# Patient Record
Sex: Female | Born: 1980 | Race: White | Hispanic: No | Marital: Married | Smoking: Current every day smoker
Health system: Southern US, Community
[De-identification: ages and names within clinical notes are randomized; demographics above are authoritative.]

## PROBLEM LIST (undated history)

## (undated) DIAGNOSIS — C801 Malignant (primary) neoplasm, unspecified: Secondary | ICD-10-CM

## (undated) HISTORY — PX: BACK SURGERY: SHX140

## (undated) HISTORY — PX: COLON SURGERY: SHX602

---

## 2013-08-25 ENCOUNTER — Emergency Department (HOSPITAL_COMMUNITY)
Admission: EM | Admit: 2013-08-25 | Discharge: 2013-08-25 | Disposition: A | Discharge status: Home / Self Care | Payer: Medicaid Other | Attending: Emergency Medicine | Admitting: Emergency Medicine

## 2013-08-25 ENCOUNTER — Encounter (HOSPITAL_COMMUNITY): Payer: Self-pay | Admitting: Emergency Medicine

## 2013-08-25 ENCOUNTER — Emergency Department (HOSPITAL_COMMUNITY): Payer: Medicaid Other

## 2013-08-25 DIAGNOSIS — S199XXA Unspecified injury of neck, initial encounter: Secondary | ICD-10-CM

## 2013-08-25 DIAGNOSIS — S20219A Contusion of unspecified front wall of thorax, initial encounter: Secondary | ICD-10-CM | POA: Insufficient documentation

## 2013-08-25 DIAGNOSIS — F17200 Nicotine dependence, unspecified, uncomplicated: Secondary | ICD-10-CM | POA: Insufficient documentation

## 2013-08-25 DIAGNOSIS — T07XXXA Unspecified multiple injuries, initial encounter: Secondary | ICD-10-CM

## 2013-08-25 DIAGNOSIS — S298XXA Other specified injuries of thorax, initial encounter: Secondary | ICD-10-CM | POA: Diagnosis not present

## 2013-08-25 DIAGNOSIS — Z9889 Other specified postprocedural states: Secondary | ICD-10-CM | POA: Diagnosis not present

## 2013-08-25 DIAGNOSIS — S0990XA Unspecified injury of head, initial encounter: Secondary | ICD-10-CM | POA: Diagnosis present

## 2013-08-25 DIAGNOSIS — G8929 Other chronic pain: Secondary | ICD-10-CM | POA: Diagnosis not present

## 2013-08-25 DIAGNOSIS — S4980XA Other specified injuries of shoulder and upper arm, unspecified arm, initial encounter: Secondary | ICD-10-CM | POA: Diagnosis not present

## 2013-08-25 DIAGNOSIS — Y92410 Unspecified street and highway as the place of occurrence of the external cause: Secondary | ICD-10-CM | POA: Insufficient documentation

## 2013-08-25 DIAGNOSIS — R0789 Other chest pain: Secondary | ICD-10-CM

## 2013-08-25 DIAGNOSIS — Y9389 Activity, other specified: Secondary | ICD-10-CM | POA: Insufficient documentation

## 2013-08-25 DIAGNOSIS — Z859 Personal history of malignant neoplasm, unspecified: Secondary | ICD-10-CM | POA: Insufficient documentation

## 2013-08-25 DIAGNOSIS — S0993XA Unspecified injury of face, initial encounter: Secondary | ICD-10-CM | POA: Insufficient documentation

## 2013-08-25 DIAGNOSIS — S46909A Unspecified injury of unspecified muscle, fascia and tendon at shoulder and upper arm level, unspecified arm, initial encounter: Secondary | ICD-10-CM | POA: Insufficient documentation

## 2013-08-25 HISTORY — DX: Malignant (primary) neoplasm, unspecified: C80.1

## 2013-08-25 MED ORDER — LORAZEPAM 1 MG PO TABS
1.0000 mg | ORAL_TABLET | Freq: Once | ORAL | Status: AC
  Administered 2013-08-25: 1 mg via ORAL
  Filled 2013-08-25: qty 1

## 2013-08-25 MED ORDER — HYDROCODONE-ACETAMINOPHEN 5-325 MG PO TABS
2.0000 | ORAL_TABLET | Freq: Once | ORAL | Status: AC
  Administered 2013-08-25: 2 via ORAL
  Filled 2013-08-25: qty 2

## 2013-08-25 MED ORDER — IBUPROFEN 800 MG PO TABS: 800.0000 mg | ORAL_TABLET | Freq: Three times a day (TID) | ORAL | Status: AC | PRN

## 2013-08-25 MED ORDER — HYDROCODONE-ACETAMINOPHEN 5-325 MG PO TABS: 2.0000 | ORAL_TABLET | ORAL | Status: AC | PRN

## 2013-08-25 NOTE — ED Notes (Signed)
Incentive spirometer provided for the patient. Explained importance of use, and teach back preformed with demonstration.  Current volume reached on last attempt: 2090mL

## 2013-08-25 NOTE — ED Notes (Signed)
Patient still of unit for testing, discussed plan of care with family.
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
## 2013-08-25 NOTE — ED Notes (Signed)
Patient is of the unit for testing.

## 2013-08-25 NOTE — ED Notes (Signed)
Radiology called pt complains of nausea.
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
